# Patient Record
Sex: Male | Born: 1965 | Race: Black or African American | Hispanic: No | Smoking: Current every day smoker
Health system: Southern US, Community
[De-identification: ages and names within clinical notes are randomized; demographics above are authoritative.]

## PROBLEM LIST (undated history)

## (undated) HISTORY — PX: ANKLE SURGERY: SHX546

---

## 2002-08-23 ENCOUNTER — Emergency Department (HOSPITAL_COMMUNITY): Admission: EM | Admit: 2002-08-23 | Discharge: 2002-08-23 | Payer: Self-pay | Admitting: Emergency Medicine

## 2002-08-23 ENCOUNTER — Encounter: Payer: Self-pay | Admitting: *Deleted

## 2005-10-18 ENCOUNTER — Emergency Department (HOSPITAL_COMMUNITY): Admission: EM | Admit: 2005-10-18 | Discharge: 2005-10-18 | Payer: Self-pay | Admitting: Emergency Medicine

## 2005-11-03 ENCOUNTER — Observation Stay (HOSPITAL_COMMUNITY): Admission: RE | Admit: 2005-11-03 | Discharge: 2005-11-04 | Payer: Self-pay | Admitting: Orthopaedic Surgery

## 2012-06-23 ENCOUNTER — Emergency Department (HOSPITAL_COMMUNITY)
Admission: EM | Admit: 2012-06-23 | Discharge: 2012-06-23 | Disposition: A | Discharge status: Home / Self Care | Payer: Self-pay | Attending: Emergency Medicine | Admitting: Emergency Medicine

## 2012-06-23 ENCOUNTER — Encounter (HOSPITAL_COMMUNITY): Payer: Self-pay

## 2012-06-23 ENCOUNTER — Emergency Department (HOSPITAL_COMMUNITY): Payer: Self-pay

## 2012-06-23 DIAGNOSIS — S51009A Unspecified open wound of unspecified elbow, initial encounter: Secondary | ICD-10-CM | POA: Insufficient documentation

## 2012-06-23 DIAGNOSIS — S6990XA Unspecified injury of unspecified wrist, hand and finger(s), initial encounter: Secondary | ICD-10-CM | POA: Insufficient documentation

## 2012-06-23 DIAGNOSIS — S59909A Unspecified injury of unspecified elbow, initial encounter: Secondary | ICD-10-CM | POA: Insufficient documentation

## 2012-06-23 DIAGNOSIS — Z23 Encounter for immunization: Secondary | ICD-10-CM | POA: Insufficient documentation

## 2012-06-23 DIAGNOSIS — S5000XA Contusion of unspecified elbow, initial encounter: Secondary | ICD-10-CM

## 2012-06-23 DIAGNOSIS — IMO0002 Reserved for concepts with insufficient information to code with codable children: Secondary | ICD-10-CM

## 2012-06-23 DIAGNOSIS — M25529 Pain in unspecified elbow: Secondary | ICD-10-CM | POA: Insufficient documentation

## 2012-06-23 DIAGNOSIS — Y929 Unspecified place or not applicable: Secondary | ICD-10-CM | POA: Insufficient documentation

## 2012-06-23 MED ORDER — TETANUS-DIPHTH-ACELL PERTUSSIS 5-2.5-18.5 LF-MCG/0.5 IM SUSP
0.5000 mL | Freq: Once | INTRAMUSCULAR | Status: AC
  Administered 2012-06-23: 0.5 mL via INTRAMUSCULAR
  Filled 2012-06-23: qty 0.5
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
## 2012-06-23 NOTE — ED Provider Notes (Signed)
Medical screening examination/treatment/procedure(s) were performed by non-physician practitioner and as supervising physician I was immediately available for consultation/collaboration.   Carleene Cooper III, MD 06/23/12 [phone]
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
## 2012-12-30 ENCOUNTER — Emergency Department (HOSPITAL_COMMUNITY): Payer: Self-pay

## 2012-12-30 ENCOUNTER — Other Ambulatory Visit: Payer: Self-pay

## 2012-12-30 ENCOUNTER — Emergency Department (HOSPITAL_COMMUNITY)
Admission: EM | Admit: 2012-12-30 | Discharge: 2012-12-30 | Disposition: A | Discharge status: Home / Self Care | Payer: Self-pay | Attending: Emergency Medicine | Admitting: Emergency Medicine

## 2012-12-30 ENCOUNTER — Encounter (HOSPITAL_COMMUNITY): Payer: Self-pay | Admitting: Emergency Medicine

## 2012-12-30 DIAGNOSIS — R780 Finding of alcohol in blood: Secondary | ICD-10-CM | POA: Insufficient documentation

## 2012-12-30 DIAGNOSIS — F10229 Alcohol dependence with intoxication, unspecified: Secondary | ICD-10-CM | POA: Insufficient documentation

## 2012-12-30 DIAGNOSIS — F17200 Nicotine dependence, unspecified, uncomplicated: Secondary | ICD-10-CM | POA: Insufficient documentation

## 2012-12-30 LAB — CBC WITH DIFFERENTIAL/PLATELET
Basophils Absolute: 0 10*3/uL (ref 0.0–0.1)
Basophils Relative: 1 % (ref 0–1)
Eosinophils Absolute: 0 10*3/uL (ref 0.0–0.7)
Eosinophils Relative: 0 % (ref 0–5)
HCT: 42 % (ref 39.0–52.0)
Hemoglobin: 14.7 g/dL (ref 13.0–17.0)
Lymphocytes Relative: 47 % — ABNORMAL HIGH (ref 12–46)
MCH: 33.7 pg (ref 26.0–34.0)
MCHC: 35 g/dL (ref 30.0–36.0)
MCV: 96.3 fL (ref 78.0–100.0)
Neutro Abs: 3.5 10*3/uL (ref 1.7–7.7)
Neutrophils Relative %: 49 % (ref 43–77)
Platelets: 279 10*3/uL (ref 150–400)
RBC: 4.36 MIL/uL (ref 4.22–5.81)
RDW: 13 % (ref 11.5–15.5)
WBC: 7.1 10*3/uL (ref 4.0–10.5)

## 2012-12-30 LAB — LIPASE, BLOOD: Lipase: 82 U/L — ABNORMAL HIGH (ref 11–59)

## 2012-12-30 LAB — COMPREHENSIVE METABOLIC PANEL
ALT: 67 U/L — ABNORMAL HIGH (ref 0–53)
AST: 40 U/L — ABNORMAL HIGH (ref 0–37)
Albumin: 4.6 g/dL (ref 3.5–5.2)
Alkaline Phosphatase: 88 U/L (ref 39–117)
BUN: 10 mg/dL (ref 6–23)
CO2: 22 mEq/L (ref 19–32)
Calcium: 9.4 mg/dL (ref 8.4–10.5)
Chloride: 107 mEq/L (ref 96–112)
Creatinine, Ser: 0.99 mg/dL (ref 0.50–1.35)
GFR calc Af Amer: >90 mL/min (ref >90)
GFR calc non Af Amer: >90 mL/min (ref >90)
Glucose, Bld: 105 mg/dL — ABNORMAL HIGH (ref 70–99)
Potassium: 3.8 mEq/L (ref 3.5–5.1)
Sodium: 143 mEq/L (ref 135–145)
Total Bilirubin: 0.3 mg/dL (ref 0.3–1.2)

## 2012-12-30 LAB — ETHANOL: Alcohol, Ethyl (B): 349 mg/dL — ABNORMAL HIGH (ref 0–11)

## 2012-12-30 MED ORDER — FAMOTIDINE 40 MG PO TABS: 40.0000 mg | ORAL_TABLET | Freq: Two times a day (BID) | ORAL | Status: DC | PRN

## 2012-12-30 MED ORDER — ONDANSETRON 8 MG PO TBDP: 8.0000 mg | ORAL_TABLET | Freq: Three times a day (TID) | ORAL | Status: DC | PRN

## 2012-12-30 NOTE — ED Provider Notes (Signed)
History    CSN: [id number] Arrival date & time 12/30/12  1350 First MD Initiated Contact with Patient 12/30/12 1352    Chief Complaint  Patient presents with  . Chest Pain    HPI The patient presented to the emergency room today because he has been having intermittent episodes of sharp moderate to severe chest pain in the substernal region off and on for the last 3 weeks. Patient states the pain would just come and go. Nothing seems to bring it on. The pain is sharp. It does not radiate. He does not relate it to eating or drinking or exertion.  Patient had another episode of the pain today so decided to come and get it evaluated in the emergency department.  The patient was celebrating today in anticipation of the Superbowl. He has had several drinks of moonshine.  He denies history of pancreatitis or liver problems. History reviewed. No pertinent past medical history.  Past Surgical History  Procedure Date  . Ankle surgery     No family history on file.  History  Substance Use Topics  . Smoking status: Current Every Day Smoker    Types: Cigarettes  . Smokeless tobacco: Not on file  . Alcohol Use: Yes      Review of Systems  All other systems reviewed and are negative.    Allergies  Review of patient's allergies indicates no known allergies.  Home Medications  No current outpatient prescriptions on file.  BP 155/94  Pulse 97  Temp 98.4 F (36.9 C) (Oral)  Resp 21  Ht 6' (1.829 m)  Wt 150 lb (68.04 kg)  BMI 20.34 kg/m2  SpO2 96%  Physical Exam  Nursing note and vitals reviewed. Constitutional: He appears well-developed and well-nourished. No distress.       intoxicated  HENT:  Head: Normocephalic and atraumatic.  Right Ear: External ear normal.  Left Ear: External ear normal.       Breath smells of alcohol  Eyes: Conjunctivae normal are normal. Right eye exhibits no discharge. Left eye exhibits no discharge. No scleral icterus.  Neck: Neck supple. No  tracheal deviation present.  Cardiovascular: Normal rate, regular rhythm and intact distal pulses.   Pulmonary/Chest: Effort normal and breath sounds normal. No stridor. No respiratory distress. He has no wheezes. He has no rales.  Abdominal: Soft. Bowel sounds are normal. He exhibits no distension. There is no tenderness. There is no rebound and no guarding.  Musculoskeletal: He exhibits no edema and no tenderness.  Neurological: He is alert. He has normal strength. No sensory deficit. Cranial nerve deficit:  no gross defecits noted. He exhibits normal muscle tone. He displays no seizure activity. Coordination normal.  Skin: Skin is warm. No rash noted. He is diaphoretic.  Psychiatric: He has a normal mood and affect.    ED Course  Procedures (including critical care time) EKG Rate 95 Normal sinus rhythm Nonspecific T wave abnormality Abnormal ECG Labs Reviewed  CBC WITH DIFFERENTIAL - Abnormal; Notable for the following:    Lymphocytes Relative 47 (*)     All other components within normal limits  COMPREHENSIVE METABOLIC PANEL - Abnormal; Notable for the following:    Glucose, Bld 105 (*)     AST 40 (*)     ALT 67 (*)     All other components within normal limits  LIPASE, BLOOD - Abnormal; Notable for the following:    Lipase 82 (*)     All other components within normal limits  ETHANOL - Abnormal; Notable for the following:    Alcohol, Ethyl (B) 349 (*)     All other components within normal limits  TROPONIN I   Dg Chest Port 1 View  12/30/2012  *RADIOLOGY REPORT*  Clinical Data: Chest pain, confusion  PORTABLE CHEST - 1 VIEW  Comparison: None.  Findings:  Examination minimally degraded secondary patient motion artifact.  Borderline enlarged cardiac silhouette.  Normal mediastinal contours.  No focal parenchymal opacity.  No definite pleural effusion or pneumothorax.  No definite evidence of edema.  No acute osseous abnormalities.  IMPRESSION: No definite acute cardiopulmonary  disease on this AP portable exam.   Original Report Authenticated By: Tacey Ruiz, MD     1. 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 His alcohol level is extremely elevated at 349. Patient is alert and oriented. I suspect he has a significant amount of situation.  I discussed the findings with the patient and the family. I explained to him how he needs to stop drinking.  Patient and family understand and they will try to dissuade him from drinking alcohol to such an excess in the future.  The patient can safely be discharged home in the care of his family at this time. I will give him a prescription for Zofran and Pepcid.       Celene Kras, MD 12/30/12 [phone]
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
## 2012-12-30 NOTE — ED Notes (Signed)
Pt c/o cp. Pt smells of etoh-states he has had several drinks of moonshine today.

## 2013-02-02 ENCOUNTER — Emergency Department (HOSPITAL_COMMUNITY)
Admission: EM | Admit: 2013-02-02 | Discharge: 2013-02-02 | Disposition: A | Discharge status: Home / Self Care | Payer: Self-pay | Attending: Emergency Medicine | Admitting: Emergency Medicine

## 2013-02-02 ENCOUNTER — Emergency Department (HOSPITAL_COMMUNITY): Payer: Self-pay

## 2013-02-02 ENCOUNTER — Encounter (HOSPITAL_COMMUNITY): Payer: Self-pay | Admitting: Emergency Medicine

## 2013-02-02 DIAGNOSIS — IMO0002 Reserved for concepts with insufficient information to code with codable children: Secondary | ICD-10-CM | POA: Insufficient documentation

## 2013-02-02 DIAGNOSIS — Y929 Unspecified place or not applicable: Secondary | ICD-10-CM | POA: Insufficient documentation

## 2013-02-02 DIAGNOSIS — Y9389 Activity, other specified: Secondary | ICD-10-CM | POA: Insufficient documentation

## 2013-02-02 DIAGNOSIS — F17200 Nicotine dependence, unspecified, uncomplicated: Secondary | ICD-10-CM | POA: Insufficient documentation

## 2013-02-02 DIAGNOSIS — M5416 Radiculopathy, lumbar region: Secondary | ICD-10-CM

## 2013-02-02 DIAGNOSIS — X500XXA Overexertion from strenuous movement or load, initial encounter: Secondary | ICD-10-CM | POA: Insufficient documentation

## 2013-02-02 MED ORDER — PREDNISONE 10 MG PO TABS: ORAL_TABLET | ORAL | Status: DC

## 2013-02-02 MED ORDER — CYCLOBENZAPRINE HCL 10 MG PO TABS: 10.0000 mg | ORAL_TABLET | Freq: Three times a day (TID) | ORAL | Status: DC | PRN

## 2013-02-02 MED ORDER — CYCLOBENZAPRINE HCL 10 MG PO TABS
10.0000 mg | ORAL_TABLET | Freq: Once | ORAL | Status: AC
  Administered 2013-02-02: 10 mg via ORAL
  Filled 2013-02-02: qty 1

## 2013-02-02 MED ORDER — HYDROMORPHONE HCL PF 1 MG/ML IJ SOLN
1.0000 mg | Freq: Once | INTRAMUSCULAR | Status: AC
  Administered 2013-02-02: 1 mg via INTRAMUSCULAR
  Filled 2013-02-02: qty 1

## 2013-02-02 MED ORDER — OXYCODONE-ACETAMINOPHEN 5-325 MG PO TABS: 1.0000 | ORAL_TABLET | ORAL | Status: DC | PRN

## 2013-02-02 NOTE — ED Notes (Signed)
States that he started having lower back pain that started 1 week ago, states that he slipped off a curb yesterday, however did not fall.  States that the pain is worse today.

## 2013-02-02 NOTE — ED Provider Notes (Signed)
History     CSN: [id number]  Arrival date & time 02/02/13  1207   First MD Initiated Contact with Patient 02/02/13 1225      Chief Complaint  Patient presents with  . Back Pain    (Consider location/radiation/quality/duration/timing/severity/associated sxs/prior treatment) HPI Comments: patient with hx of chronic low back pain c/o worsening pain for one week.  States the pain became worse yesterday after he stepped off a curb and twisted his back. He denies fall.  Also states the pain radiates into his right thigh which is different from previous pain.  He denies numbness or weakness of the extremities, dysuria, incontinence of bladder or bowel, saddle anesthesia's or abd pain.    Patient is a 47 y.o. male presenting with back pain. The history is provided by the patient.  Back Pain Location:  Lumbar spine Quality:  Aching, stiffness and shooting Radiates to:  R thigh Pain severity:  Moderate Onset quality:  Gradual Duration:  7 days Timing:  Constant Progression:  Worsening Chronicity:  Recurrent Context: recent injury and twisting   Context: not emotional stress, not falling, not jumping from heights, not MCA, not physical stress and not recent illness   Relieved by:  Being still Worsened by:  Ambulation, bending, movement, standing and twisting Ineffective treatments:  OTC medications Associated symptoms: leg pain   Associated symptoms: no abdominal pain, no abdominal swelling, no bladder incontinence, no bowel incontinence, no chest pain, no dysuria, no fever, no headaches, no numbness, no paresthesias, no pelvic pain, no perianal numbness, no tingling and no weakness     History reviewed. No pertinent past medical history.  Past Surgical History  Procedure Laterality Date  . Ankle surgery      No family history on file.  History  Substance Use Topics  . Smoking status: Current Every Day Smoker    Types: Cigarettes  . Smokeless tobacco: Not on file  . Alcohol  Use: Yes      Review of Systems  Constitutional: Negative for fever.  Respiratory: Negative for shortness of breath.   Cardiovascular: Negative for chest pain.  Gastrointestinal: Negative for vomiting, abdominal pain, constipation and bowel incontinence.  Genitourinary: Negative for bladder incontinence, dysuria, hematuria, flank pain, decreased urine volume, difficulty urinating and pelvic pain.       No perineal numbness or incontinence of urine or feces  Musculoskeletal: Positive for back pain. Negative for joint swelling.  Skin: Negative for rash.  Neurological: Negative for tingling, weakness, numbness, headaches and paresthesias.  All other systems reviewed and are negative.    Allergies  Strawberry  Home Medications   Current Outpatient Rx  Name  Route  Sig  Dispense  Refill  . acetaminophen (TYLENOL) 500 MG tablet   Oral   Take 2,000 mg by mouth every 6 (six) hours as needed for pain.         . famotidine (PEPCID) 20 MG tablet   Oral   Take 20 mg by mouth daily as needed for heartburn.         . naproxen sodium (ALEVE) 220 MG tablet   Oral   Take 880 mg by mouth 2 (two) times daily as needed (pain).            BP 168/101  Pulse 82  Temp(Src) 97.9 F (36.6 C) (Oral)  Resp 20  Ht 6' (1.829 m)  Wt 260 lb (117.935 kg)  BMI 35.25 kg/m2  SpO2 100%  Physical Exam  Nursing note and vitals  reviewed. Constitutional: He is oriented to person, place, and time. He appears well-developed and well-nourished. No distress.  HENT:  Head: Normocephalic and atraumatic.  Neck: Normal range of motion. Neck supple.  Cardiovascular: Normal rate, regular rhythm, normal heart sounds and intact distal pulses.   No murmur heard. Pulmonary/Chest: Effort normal and breath sounds normal. No respiratory distress.  Abdominal: He exhibits no distension. There is no tenderness. There is no rebound and no guarding.  Musculoskeletal: He exhibits tenderness. He exhibits no edema.         Lumbar back: He exhibits tenderness and pain. He exhibits normal range of motion, no swelling, no deformity, no laceration and normal pulse.       Back:  ttp of the right lumbar paraspinal muscles and SI joint.  Distal sensation intact.  DP pulses are brisk and symmetrical  Neurological: He is alert and oriented to person, place, and time. No sensory deficit. He exhibits normal muscle tone. Coordination and gait normal.  Reflex Scores:      Patellar reflexes are 2+ on the right side and 2+ on the left side.      Achilles reflexes are 2+ on the right side and 2+ on the left side. Skin: Skin is warm and dry.    ED Course  Procedures (including critical care time)  Labs Reviewed - No data to display Dg Lumbar Spine Complete  02/02/2013  *RADIOLOGY REPORT*  Clinical Data: Fall, low back pain  LUMBAR SPINE - COMPLETE 4+ VIEW  Comparison: None.  Findings: Vestigial left rib at T12.  Five lumbar-type vertebral bodies.  Normal lumbar lordosis.  No evidence of fracture dislocation.  Vertebral body heights are maintained.  Mild degenerative changes of the lower lumbar spine.  Visualized bony pelvis appears intact.  IMPRESSION: No fracture or dislocation is seen.  Mild degenerative changes of the lower lumbar spine.   Original Report Authenticated By: Charline Bills, M.D.        MDM    Patient has ttp of the right lumbar paraspinal muscles and SI joint.  No focal neuro deficits on exam.  Ambulates with a steady gait.   Pain reproduced with SLR on right.  Doubt emergent neurological or infectious process.    Pain improved with IM dilaudid and flexeril po.    Pt agrees to close f/u with PMD.    Prescribed: Percocet #20 Prednisone taper flexeril      Tammy L. Trisha Mangle, PA-C 02/03/13 [phone]

## 2013-02-03 NOTE — ED Provider Notes (Signed)
Medical screening examination/treatment/procedure(s) were performed by non-physician practitioner and as supervising physician I was immediately available for consultation/collaboration.   Lyanne Co, MD 02/03/13 1550

## 2014-01-31 ENCOUNTER — Emergency Department (HOSPITAL_COMMUNITY): Payer: Self-pay

## 2014-01-31 ENCOUNTER — Encounter (HOSPITAL_COMMUNITY): Payer: Self-pay | Admitting: Emergency Medicine

## 2014-01-31 ENCOUNTER — Emergency Department (HOSPITAL_COMMUNITY)
Admission: EM | Admit: 2014-01-31 | Discharge: 2014-02-01 | Disposition: A | Discharge status: Home / Self Care | Payer: Self-pay | Attending: Emergency Medicine | Admitting: Emergency Medicine

## 2014-01-31 DIAGNOSIS — F1010 Alcohol abuse, uncomplicated: Secondary | ICD-10-CM | POA: Insufficient documentation

## 2014-01-31 DIAGNOSIS — H11429 Conjunctival edema, unspecified eye: Secondary | ICD-10-CM | POA: Insufficient documentation

## 2014-01-31 DIAGNOSIS — F10929 Alcohol use, unspecified with intoxication, unspecified: Secondary | ICD-10-CM

## 2014-01-31 DIAGNOSIS — S0181XA Laceration without foreign body of other part of head, initial encounter: Secondary | ICD-10-CM

## 2014-01-31 DIAGNOSIS — H11422 Conjunctival edema, left eye: Secondary | ICD-10-CM

## 2014-01-31 DIAGNOSIS — S0120XA Unspecified open wound of nose, initial encounter: Secondary | ICD-10-CM | POA: Insufficient documentation

## 2014-01-31 DIAGNOSIS — H0520 Unspecified exophthalmos: Secondary | ICD-10-CM | POA: Insufficient documentation

## 2014-01-31 DIAGNOSIS — S0180XA Unspecified open wound of other part of head, initial encounter: Secondary | ICD-10-CM | POA: Insufficient documentation

## 2014-01-31 DIAGNOSIS — S0990XA Unspecified injury of head, initial encounter: Secondary | ICD-10-CM | POA: Insufficient documentation

## 2014-01-31 DIAGNOSIS — S01119A Laceration without foreign body of unspecified eyelid and periocular area, initial encounter: Secondary | ICD-10-CM | POA: Insufficient documentation

## 2014-01-31 DIAGNOSIS — S022XXA Fracture of nasal bones, initial encounter for closed fracture: Secondary | ICD-10-CM | POA: Insufficient documentation

## 2014-01-31 LAB — COMPREHENSIVE METABOLIC PANEL
ALT: 55 U/L — ABNORMAL HIGH (ref 0–53)
AST: 31 U/L (ref 0–37)
Albumin: 4.4 g/dL (ref 3.5–5.2)
Alkaline Phosphatase: 94 U/L (ref 39–117)
BILIRUBIN TOTAL: 0.3 mg/dL (ref 0.3–1.2)
BUN: 11 mg/dL (ref 6–23)
CHLORIDE: 104 meq/L (ref 96–112)
CO2: 25 meq/L (ref 19–32)
Calcium: 9.5 mg/dL (ref 8.4–10.5)
Creatinine, Ser: 0.99 mg/dL (ref 0.50–1.35)
GFR calc Af Amer: >90 mL/min (ref >90)
GLUCOSE: 98 mg/dL (ref 70–99)
Potassium: 4 mEq/L (ref 3.7–5.3)
Sodium: 143 mEq/L (ref 137–147)
Total Protein: 8.2 g/dL (ref 6.0–8.3)

## 2014-01-31 LAB — CBC WITH DIFFERENTIAL/PLATELET
Basophils Absolute: 0 10*3/uL (ref 0.0–0.1)
Basophils Relative: 0 % (ref 0–1)
EOS ABS: 0 10*3/uL (ref 0.0–0.7)
Eosinophils Relative: 0 % (ref 0–5)
HCT: 42.8 % (ref 39.0–52.0)
Hemoglobin: 14.6 g/dL (ref 13.0–17.0)
LYMPHS ABS: 2.1 10*3/uL (ref 0.7–4.0)
Lymphocytes Relative: 20 % (ref 12–46)
MCH: 33 pg (ref 26.0–34.0)
MCHC: 34.1 g/dL (ref 30.0–36.0)
MCV: 96.6 fL (ref 78.0–100.0)
Monocytes Absolute: 0.4 10*3/uL (ref 0.1–1.0)
Monocytes Relative: 4 % (ref 3–12)
NEUTROS PCT: 76 % (ref 43–77)
Neutro Abs: 8.1 10*3/uL — ABNORMAL HIGH (ref 1.7–7.7)
Platelets: 311 10*3/uL (ref 150–400)
RBC: 4.43 MIL/uL (ref 4.22–5.81)
RDW: 13.1 % (ref 11.5–15.5)
WBC: 10.7 10*3/uL — ABNORMAL HIGH (ref 4.0–10.5)

## 2014-01-31 LAB — ETHANOL: Alcohol, Ethyl (B): 229 mg/dL — ABNORMAL HIGH (ref 0–11)

## 2014-01-31 LAB — PROTIME-INR
INR: 0.96 (ref 0.00–1.49)
PROTHROMBIN TIME: 12.6 s (ref 11.6–15.2)
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
## 2014-01-31 NOTE — ED Notes (Addendum)
Patient refusing blood work at this time MD notified

## 2014-01-31 NOTE — ED Notes (Signed)
Patient states that he will have his blood drawn at this time. States that his left eye is hurting and would like some pain medication at this.
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
## 2014-02-01 MED ORDER — BACITRACIN ZINC 500 UNIT/GM EX OINT
TOPICAL_OINTMENT | Freq: Once | CUTANEOUS | Status: AC
  Administered 2014-02-01: 1 via TOPICAL

## 2014-02-01 MED ORDER — HYDROCODONE-ACETAMINOPHEN 5-325 MG PO TABS: 1.0000 | ORAL_TABLET | ORAL | Status: AC | PRN

## 2014-02-01 MED ORDER — BACITRACIN ZINC 500 UNIT/GM EX OINT
TOPICAL_OINTMENT | CUTANEOUS | Status: AC
  Filled 2014-02-01: qty 0.9

## 2014-02-01 NOTE — ED Notes (Signed)
Call Elouise Munroeanielle Shappell, daughter, @ [phone] when patient is ready to be discharged.

## 2014-02-01 NOTE — Discharge Instructions (Signed)
Alcohol Intoxication Alcohol intoxication occurs when the amount of alcohol that a person has consumed impairs his or her ability to mentally and physically function. Alcohol directly impairs the normal chemical activity of the brain. Drinking large amounts of alcohol can lead to changes in mental function and behavior, and it can cause many physical effects that can be harmful.  Alcohol intoxication can range in severity from mild to very severe. Various factors can affect the level of intoxication that occurs, such as the person's age, gender, weight, frequency of alcohol consumption, and the presence of other medical conditions (such as diabetes, seizures, or heart conditions). Dangerous levels of alcohol intoxication may occur when people drink large amounts of alcohol in a short period (binge drinking). Alcohol can also be especially dangerous when combined with certain prescription medicines or "recreational" drugs. SIGNS AND SYMPTOMS Some common signs and symptoms of mild alcohol intoxication include:  Loss of coordination.  Changes in mood and behavior.  Impaired judgment.  Slurred speech. As alcohol intoxication progresses to more severe levels, other signs and symptoms will appear. These may include:  Vomiting.  Confusion and impaired memory.  Slowed breathing.  Seizures.  Loss of consciousness. DIAGNOSIS  Your health care provider will take a medical history and perform a physical exam. You will be asked about the amount and type of alcohol you have consumed. Blood tests will be done to measure the concentration of alcohol in your blood. In many places, your blood alcohol level must be lower than 80 mg/dL (2.95%) to legally drive. However, many dangerous effects of alcohol can occur at much lower levels.  TREATMENT  People with alcohol intoxication often do not require treatment. Most of the effects of alcohol intoxication are temporary, and they go away as the alcohol naturally  leaves the body. Your health care provider will monitor your condition until you are stable enough to go home. Fluids are sometimes given through an IV access tube to help prevent dehydration.  HOME CARE INSTRUCTIONS  Do not drive after drinking alcohol.  Stay hydrated. Drink enough water and fluids to keep your urine clear or pale yellow. Avoid caffeine.   Only take over-the-counter or prescription medicines as directed by your health care provider.  SEEK MEDICAL CARE IF:   You have persistent vomiting.   You do not feel better after a few days.  You have frequent alcohol intoxication. Your health care provider can help determine if you should see a substance use treatment counselor. SEEK IMMEDIATE MEDICAL CARE IF:   You become shaky or tremble when you try to stop drinking.   You shake uncontrollably (seizure).   You throw up (vomit) blood. This may be bright red or may look like black coffee grounds.   You have blood in your stool. This may be bright red or may appear as a black, tarry, bad smelling stool.   You become lightheaded or faint.  MAKE SURE YOU:   Understand these instructions.  Will watch your condition.  Will get help right away if you are not doing well or get worse. Document Released: 08/24/2005 Document Revised: 07/17/2013 Document Reviewed: 04/19/2013 [facility] Patient Information 2014 [place], [place].  Assault, General Assault includes any behavior, whether intentional or reckless, which results in bodily injury to another person and/or damage to property. Included in this would be any behavior, intentional or reckless, that by its nature would be understood (interpreted) by a reasonable person as intent to harm another person or to damage his/her property.  Threats may be oral or written. They may be communicated through regular mail, computer, fax, or phone. These threats may be direct or implied. FORMS OF ASSAULT INCLUDE:  Physically assaulting a  person. This includes physical threats to inflict physical harm as well as:  Slapping.  Hitting.  Poking.  Kicking.  Punching.  Pushing.  Arson.  Sabotage.  Equipment vandalism.  Damaging or destroying property.  Throwing or hitting objects.  Displaying a weapon or an object that appears to be a weapon in a threatening manner.  Carrying a firearm of any kind.  Using a weapon to harm someone.  Using greater physical size/strength to intimidate another.  Making intimidating or threatening gestures.  Bullying.  Hazing.  Intimidating, threatening, hostile, or abusive language directed toward another person.  It communicates the intention to engage in violence against that person. And it leads a reasonable person to expect that violent behavior may occur.  Stalking another person. IF IT HAPPENS AGAIN:  Immediately call for emergency help (911 in [place]  If someone poses clear and immediate danger to you, seek legal authorities to have a protective or restraining order put in place.  Less threatening assaults can at least be reported to authorities. STEPS TO TAKE IF A SEXUAL ASSAULT HAS HAPPENED  Go to an area of safety. This may include a shelter or staying with a friend. Stay away from the area where you have been attacked. A large percentage of sexual assaults are caused by a friend, relative or associate.  If medications were given by your caregiver, take them as directed for the full length of time prescribed.  Only take over-the-counter or prescription medicines for pain, discomfort, or fever as directed by your caregiver.  If you have come in contact with a sexual disease, find out if you are to be tested again. If your caregiver is concerned about the HIV/AIDS virus, he/she may require you to have continued testing for several months.  For the protection of your privacy, test results can not be given over the phone. Make sure you receive the results of  your test. If your test results are not back during your visit, make an appointment with your caregiver to find out the results. Do not assume everything is normal if you have not heard from your caregiver or the medical facility. It is important for you to follow up on all of your test results.  File appropriate papers with authorities. This is important in all assaults, even if it has occurred in a family or by a friend. SEEK MEDICAL CARE IF:  You have new problems because of your injuries.  You have problems that may be because of the medicine you are taking, such as:  Rash.  Itching.  Swelling.  Trouble breathing.  You develop belly (abdominal) pain, feel sick to your stomach (nausea) or are vomiting.  You begin to run a temperature.  You need supportive care or referral to a rape crisis center. These are centers with trained personnel who can help you get through this ordeal. SEEK IMMEDIATE MEDICAL CARE IF:  You are afraid of being threatened, beaten, or abused. In [place] call 911.  You receive new injuries related to abuse.  You develop severe pain in any area injured in the assault or have any change in your condition that concerns you.  You faint or lose consciousness.  You develop chest pain or shortness of breath. Document Released: 11/14/2005 Document Revised: 02/06/2012 Document Reviewed: 07/02/2008 [employer] Patient  Information 2014 [place], [place].  Facial Laceration  A facial laceration is a cut on the face. These injuries can be painful and cause bleeding. Lacerations usually heal quickly, but they need special care to reduce scarring. DIAGNOSIS  Your health care provider will take a medical history, ask for details about how the injury occurred, and examine the wound to determine how deep the cut is. TREATMENT  Some facial lacerations may not require closure. Others may not be able to be closed because of an increased risk of infection. The risk of infection  and the chance for successful closure will depend on various factors, including the amount of time since the injury occurred. The wound may be cleaned to help prevent infection. If closure is appropriate, pain medicines may be given if needed. Your health care provider will use stitches (sutures), wound glue (adhesive), or skin adhesive strips to repair the laceration. These tools bring the skin edges together to allow for faster healing and a better cosmetic outcome. If needed, you may also be given a tetanus shot. HOME CARE INSTRUCTIONS  Only take over-the-counter or prescription medicines as directed by your health care provider.  Follow your health care provider's instructions for wound care. These instructions will vary depending on the technique used for closing the wound. For Sutures:  Keep the wound clean and dry.   If you were given a bandage (dressing), you should change it at least once a day. Also change the dressing if it becomes wet or dirty, or as directed by your health care provider.   Wash the wound with soap and water 2 times a day. Rinse the wound off with water to remove all soap. Pat the wound dry with a clean towel.   After cleaning, apply a thin layer of the antibiotic ointment recommended by your health care provider. This will help prevent infection and keep the dressing from sticking.   You may shower as usual after the first 24 hours. Do not soak the wound in water until the sutures are removed.   Get your sutures removed as directed by your health care provider. With facial lacerations, sutures should usually be taken out after 4 5 days to avoid stitch marks.   Wait a few days after your sutures are removed before applying any makeup. For Skin Adhesive Strips:  Keep the wound clean and dry.   Do not get the skin adhesive strips wet. You may bathe carefully, using caution to keep the wound dry.   If the wound gets wet, pat it dry with a clean towel.    Skin adhesive strips will fall off on their own. You may trim the strips as the wound heals. Do not remove skin adhesive strips that are still stuck to the wound. They will fall off in time.  For Wound Adhesive:  You may briefly wet your wound in the shower or bath. Do not soak or scrub the wound. Do not swim. Avoid periods of heavy sweating until the skin adhesive has fallen off on its own. After showering or bathing, gently pat the wound dry with a clean towel.   Do not apply liquid medicine, cream medicine, ointment medicine, or makeup to your wound while the skin adhesive is in place. This may loosen the film before your wound is healed.   If a dressing is placed over the wound, be careful not to apply tape directly over the skin adhesive. This may cause the adhesive to be pulled off before  the wound is healed.   Avoid prolonged exposure to sunlight or tanning lamps while the skin adhesive is in place.  The skin adhesive will usually remain in place for 5 10 days, then naturally fall off the skin. Do not pick at the adhesive film.  After Healing: Once the wound has healed, cover the wound with sunscreen during the day for 1 full year. This can help minimize scarring. Exposure to ultraviolet light in the first year will darken the scar. It can take 1 2 years for the scar to lose its redness and to heal completely.  SEEK IMMEDIATE MEDICAL CARE IF:  You have redness, pain, or swelling around the wound.   You see ayellowish-white fluid (pus) coming from the wound.   You have chills or a fever.  MAKE SURE YOU:  Understand these instructions.  Will watch your condition.  Will get help right away if you are not doing well or get worse. Document Released: 12/22/2004 Document Revised: 09/04/2013 Document Reviewed: 06/27/2013 [facility] Patient Information 2014 [place], [place]  Nasal Fracture A nasal fracture is a break or crack in the bones of the nose. A minor break usually  heals in a month. You often will receive black eyes from a nasal fracture. This is not a cause for concern. The black eyes will go away over 1 to 2 weeks.  DIAGNOSIS  Your caregiver may want to examine you if you are concerned about a fracture of the nose. X-rays of the nose may not show a nasal fracture even when one is present. Sometimes your caregiver must wait 1 to 5 days after the injury to re-check the nose for alignment and to take additional X-rays. Sometimes the caregiver must wait until the swelling has gone down. TREATMENT Minor fractures that have caused no deformity often do not require treatment. More serious fractures where bones are displaced may require surgery. This will take place after the swelling is gone. Surgery will stabilize and align the fracture. HOME CARE INSTRUCTIONS   Put ice on the injured area.  Put ice in a plastic bag.  Place a towel between your skin and the bag.  Leave the ice on for 15-20 minutes, 03-04 times a day.  Take medications as directed by your caregiver.  Only take over-the-counter or prescription medicines for pain, discomfort, or fever as directed by your caregiver.  If your nose starts bleeding, squeeze the soft parts of the nose against the center wall while you are sitting in an upright position for 10 minutes.  Contact sports should be avoided for at least 3 to 4 weeks or as directed by your caregiver. SEEK MEDICAL CARE IF:  Your pain increases or becomes severe.  You continue to have nosebleeds.  The shape of your nose does not return to normal within 5 days.  You have pus draining from the nose. SEEK IMMEDIATE MEDICAL CARE IF:   You have bleeding from your nose that does not stop after 20 minutes of pinching the nostrils closed and keeping ice on the nose.  You have clear fluid draining from your nose.  You notice a grape-like swelling on the dividing wall between the nostrils (septum). This is a collection of blood  (hematoma) that must be drained to help prevent infection.  You have difficulty moving your eyes.  You have recurrent vomiting. Document Released: 11/11/2000 Document Revised: 02/06/2012 Document Reviewed: 02/28/2011 [facility] Patient Information 2014 [place], [place].

## 2014-02-01 NOTE — ED Notes (Signed)
Daughter called to come and pick up patient. States that she will be on her way.

## 2014-02-19 ENCOUNTER — Encounter (HOSPITAL_COMMUNITY): Payer: Self-pay | Admitting: Emergency Medicine

## 2014-02-19 ENCOUNTER — Emergency Department (HOSPITAL_COMMUNITY)
Admission: EM | Admit: 2014-02-19 | Discharge: 2014-02-19 | Disposition: A | Discharge status: Home / Self Care | Payer: Self-pay | Attending: Emergency Medicine | Admitting: Emergency Medicine

## 2014-02-19 DIAGNOSIS — Z4802 Encounter for removal of sutures: Secondary | ICD-10-CM | POA: Insufficient documentation

## 2014-02-19 DIAGNOSIS — F17200 Nicotine dependence, unspecified, uncomplicated: Secondary | ICD-10-CM | POA: Insufficient documentation

## 2014-02-19 NOTE — ED Provider Notes (Signed)
CSN: [id number]     Arrival date & time 02/19/14  [id number] History   First MD Initiated Contact with Patient 02/19/14 [phone]     Chief Complaint  Patient presents with  . Suture / Staple Removal     (Consider location/radiation/quality/duration/timing/severity/associated sxs/prior Treatment) Patient is a 48 y.o. male presenting with suture removal.  Suture / Staple Removal The current episode started 1 to 4 weeks ago. Pertinent negatives include no fever.  2 weeks ago sutures were placed. Here for suture removal. He reports 4 sutures at the bridge of his nose and 3 on his left eyelid. No drainage, erythema or swelling reported.    History reviewed. No pertinent past medical history. Past Surgical History  Procedure Laterality Date  . Ankle surgery     No family history on file. History  Substance Use Topics  . Smoking status: Current Every Day Smoker    Types: Cigarettes  . Smokeless tobacco: Not on file  . Alcohol Use: Yes     Comment: occ    Review of Systems  Constitutional: Negative for fever.  Skin: Positive for wound.       Healed lacerations. Nose and left eyelid  All other systems reviewed and are negative.      Allergies  Strawberry  Home Medications   Current Outpatient Rx  Name  Route  Sig  Dispense  Refill  . HYDROcodone-acetaminophen (NORCO/VICODIN) 5-325 MG per tablet   Oral   Take 1 tablet by mouth every 4 (four) hours as needed.   15 tablet   0    BP 145/98  Pulse 68  Temp(Src) 98 F (36.7 C) (Oral)  Resp 20  Ht 6' (1.829 m)  Wt 265 lb (120.203 kg)  BMI 35.93 kg/m2  SpO2 99% Physical Exam  Nursing note and vitals reviewed. Constitutional: He is oriented to person, place, and time. He appears well-developed and well-nourished. No distress.  Well-appearing  HENT:  Head: Normocephalic.  Right Ear: External ear normal.  Left Ear: External ear normal.  Eyes: Conjunctivae and EOM are normal.  Neck: Normal range of motion. Neck supple.   Cardiovascular: Normal rate, regular rhythm and normal heart sounds.   Neurological: He is alert and oriented to person, place, and time.  Skin: Skin is warm and dry. No rash noted. No erythema.  Healed lacerations x2. Bridge of nose and left eyelid.   Psychiatric: He has a normal mood and affect. His behavior is normal. Judgment and thought content normal.    ED Course  Procedures (including critical care time) Labs Review Labs Reviewed - No data to display Imaging Review No results found.   EKG Interpretation None      MDM   Final diagnoses:  Visit for suture removal   Completely healed lacerations. #1 on the bridge of his nose, 4 sutures removed. #2 left eyelid, 3 sutures removed. Discussed skin care with pt.      Irish EldersKelly Aidee Latimore, NP 02/19/14 [phone]

## 2014-02-19 NOTE — ED Provider Notes (Signed)
Medical screening examination/treatment/procedure(s) were performed by non-physician practitioner and as supervising physician I was immediately available for consultation/collaboration.   EKG Interpretation None        Akash Winski L Viviana Trimble, MD 02/19/14 1351 

## 2014-02-19 NOTE — Discharge Instructions (Signed)
Suture Removal, Care After Refer to this sheet in the next few weeks. These instructions provide you with information on caring for yourself after your procedure. Your health care provider may also give you more specific instructions. Your treatment has been planned according to current medical practices, but problems sometimes occur. Call your health care provider if you have any problems or questions after your procedure. WHAT TO EXPECT AFTER THE PROCEDURE After your stitches (sutures) are removed, it is typical to have the following:  Some discomfort and swelling in the wound area.  Slight redness in the area. HOME CARE INSTRUCTIONS   If you have skin adhesive strips over the wound area, do not take the strips off. They will fall off on their own in a few days. If the strips remain in place after 14 days, you may remove them.  Change any bandages (dressings) at least once a day or as directed by your health care provider. If the bandage sticks, soak it off with warm, soapy water.  Apply cream or ointment only as directed by your health care provider. If using cream or ointment, wash the area with soap and water 2 times a day to remove all the cream or ointment. Rinse off the soap and pat the area dry with a clean towel.  Keep the wound area dry and clean. If the bandage becomes wet or dirty, or if it develops a bad smell, change it as soon as possible.  Continue to protect the wound from injury.  Use sunscreen when out in the sun. New scars become sunburned easily. SEEK MEDICAL CARE IF:  You have increasing redness, swelling, or pain in the wound.  You see pus coming from the wound.  You have a fever.  You notice a bad smell coming from the wound or dressing.  Your wound breaks open (edges not staying together). Document Released: 08/09/2001 Document Revised: 09/04/2013 Document Reviewed: 06/26/2013 [employer] Patient Information 2014 [employer].  

## 2014-02-19 NOTE — ED Notes (Signed)
Pt here for suture removal.  Reports sutures have been in for over 2 weeks.

## 2015-01-18 IMAGING — CR DG LUMBAR SPINE COMPLETE 4+V
5 series · 5 of 5 positions shown · non-contrast
Comparison: None.

CLINICAL DATA: Fall, low back pain

LUMBAR SPINE - COMPLETE 4+ VIEW

[view not recorded (1 of 5)]
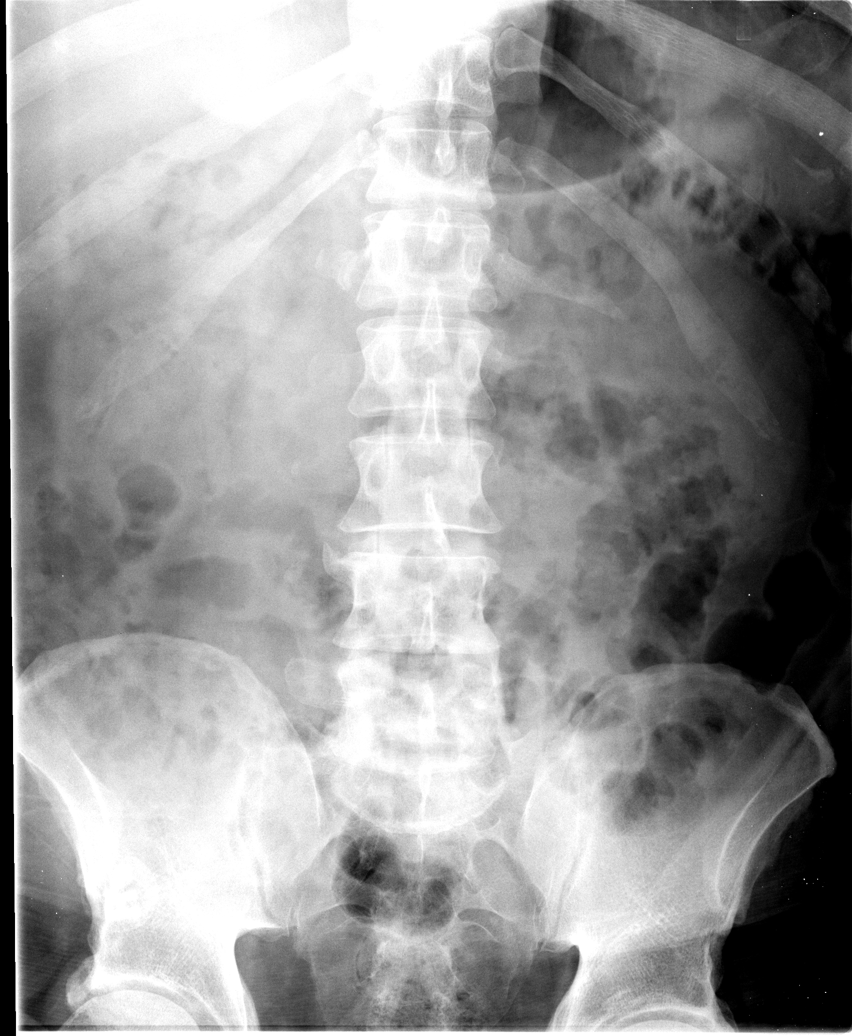

[view not recorded (2 of 5)]
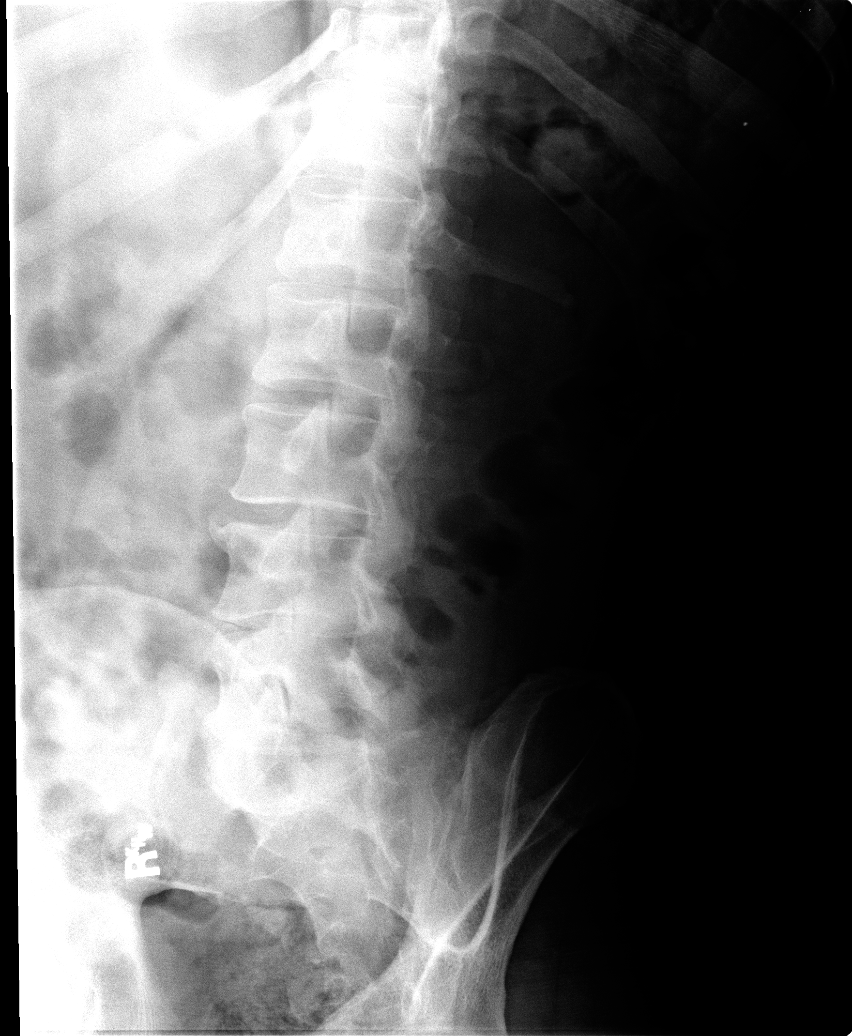

[view not recorded (3 of 5)]
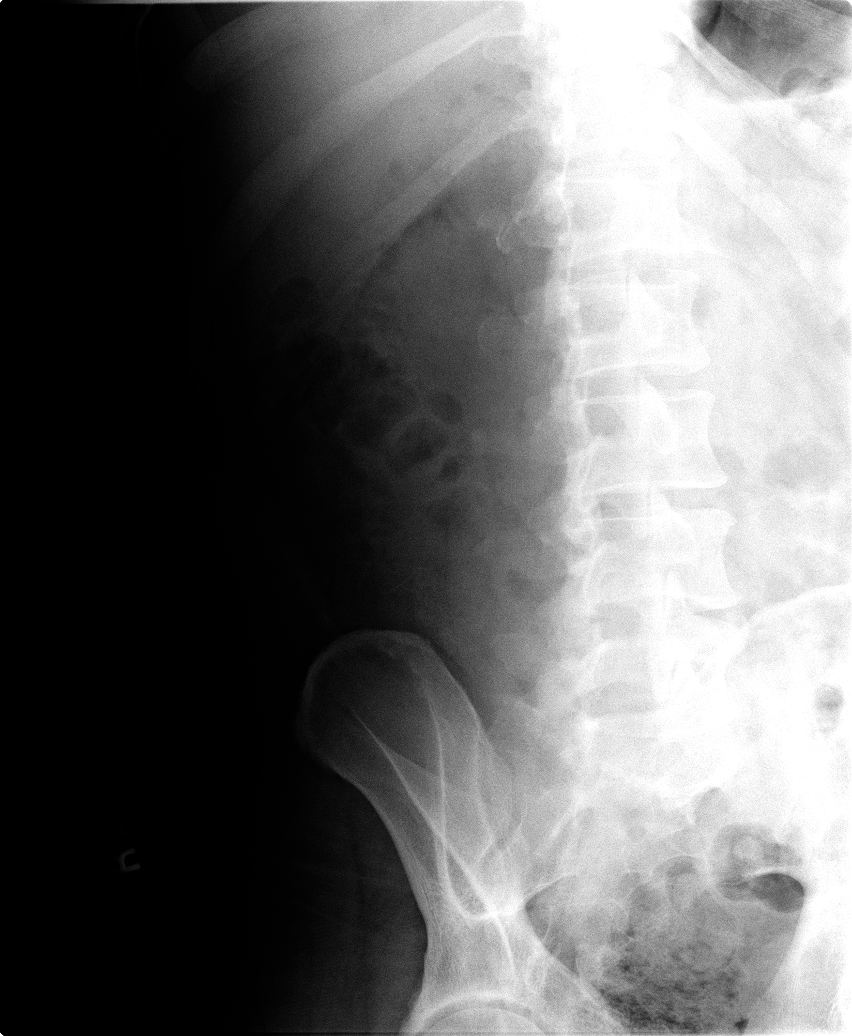

[view not recorded (4 of 5)]
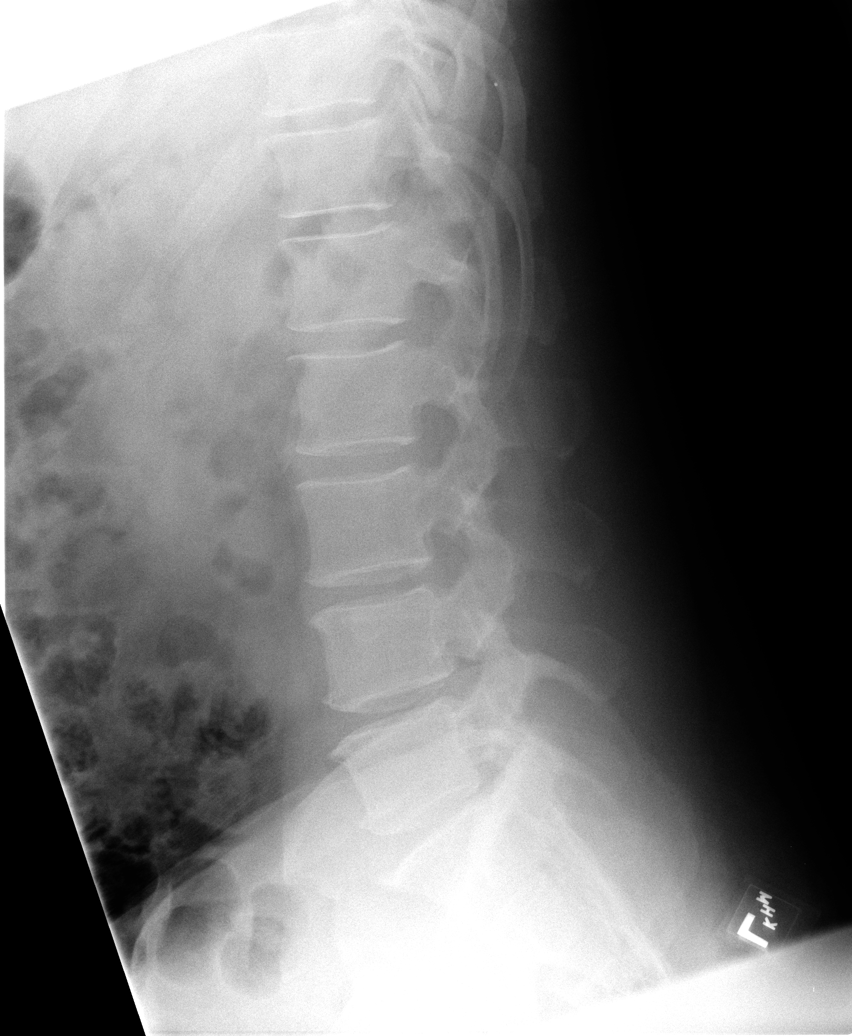

[view not recorded (5 of 5)]
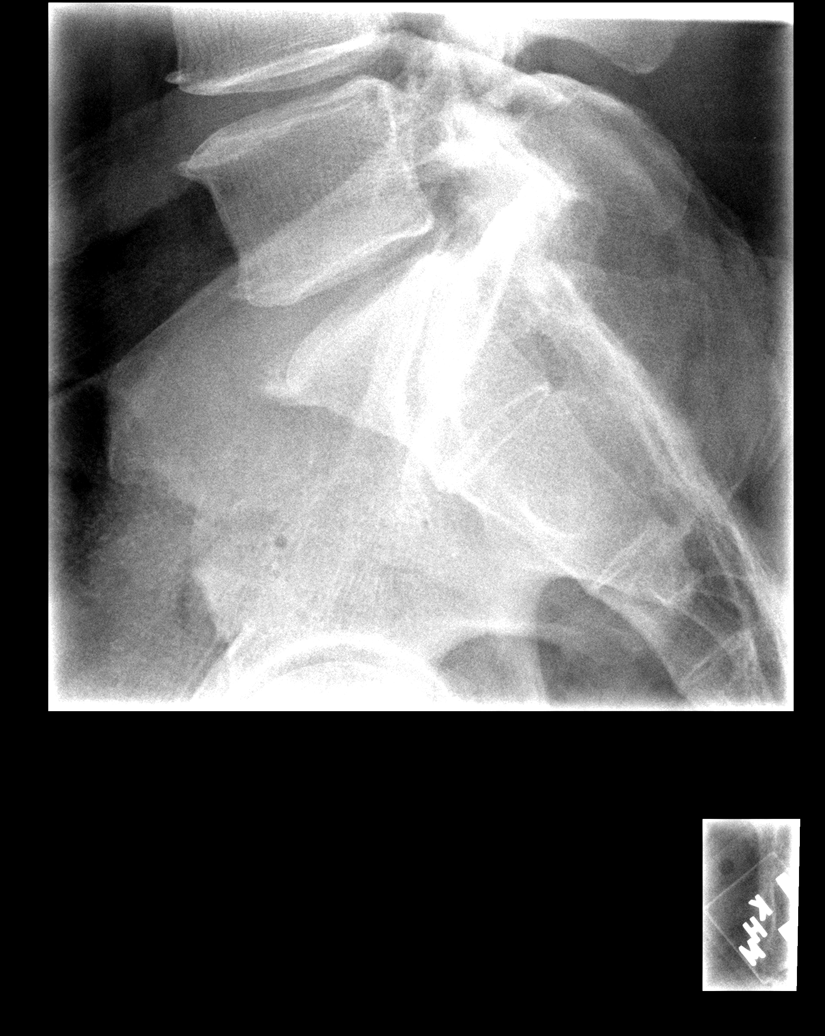

[5 of 5 positions shown; findings below may reference images not displayed]

FINDINGS: Vestigial left rib at T12.  Five lumbar-type vertebral
bodies.

Normal lumbar lordosis.

No evidence of fracture dislocation.  Vertebral body heights are
maintained.

Mild degenerative changes of the lower lumbar spine.

Visualized bony pelvis appears intact.
IMPRESSION: No fracture or dislocation is seen.

Mild degenerative changes of the lower lumbar spine.

## 2017-05-31 ENCOUNTER — Emergency Department (HOSPITAL_COMMUNITY)
Admission: EM | Admit: 2017-05-31 | Discharge: 2017-05-31 | Disposition: A | Discharge status: Home / Self Care | Payer: Self-pay | Attending: Emergency Medicine | Admitting: Emergency Medicine

## 2017-05-31 ENCOUNTER — Encounter (HOSPITAL_COMMUNITY): Payer: Self-pay | Admitting: *Deleted

## 2017-05-31 DIAGNOSIS — Y939 Activity, unspecified: Secondary | ICD-10-CM | POA: Insufficient documentation

## 2017-05-31 DIAGNOSIS — Y929 Unspecified place or not applicable: Secondary | ICD-10-CM | POA: Insufficient documentation

## 2017-05-31 DIAGNOSIS — S91311A Laceration without foreign body, right foot, initial encounter: Secondary | ICD-10-CM | POA: Insufficient documentation

## 2017-05-31 DIAGNOSIS — Y33XXXA Other specified events, undetermined intent, initial encounter: Secondary | ICD-10-CM | POA: Insufficient documentation

## 2017-05-31 DIAGNOSIS — Y999 Unspecified external cause status: Secondary | ICD-10-CM | POA: Insufficient documentation

## 2017-05-31 DIAGNOSIS — F17210 Nicotine dependence, cigarettes, uncomplicated: Secondary | ICD-10-CM | POA: Insufficient documentation

## 2017-05-31 DIAGNOSIS — F10920 Alcohol use, unspecified with intoxication, uncomplicated: Secondary | ICD-10-CM | POA: Insufficient documentation

## 2017-05-31 MED ORDER — SULFAMETHOXAZOLE-TRIMETHOPRIM 800-160 MG PO TABS: 1.0000 | ORAL_TABLET | Freq: Two times a day (BID) | ORAL | 0 refills | Status: AC

## 2017-05-31 MED ORDER — TETANUS-DIPHTH-ACELL PERTUSSIS 5-2.5-18.5 LF-MCG/0.5 IM SUSP
0.5000 mL | Freq: Once | INTRAMUSCULAR | Status: DC
  Filled 2017-05-31: qty 0.5

## 2017-05-31 MED ORDER — LIDOCAINE-EPINEPHRINE 2 %-1:200000 IJ SOLN
10.0000 mL | Freq: Once | INTRAMUSCULAR | Status: AC
Start: 2017-05-31 — End: 2017-05-31
  Administered 2017-05-31: 5 mL
  Filled 2017-05-31: qty 20

## 2017-05-31 NOTE — ED Provider Notes (Signed)
[facility]-EMERGENCY DEPT Provider Note   CSN: [id number] Arrival date & time: 05/31/17  1826     History   Chief Complaint Chief Complaint  Patient presents with  . Extremity Laceration    HPI Anthony Bright is a 51 y.o. male presenting with right foot injury.  Patient states he is unsure how or when he injured his right foot. He states that he chronically has issues with this foot, as he has had ankle surgery, but sensation feels no different than normal. Patient is ambulatory. He denies injury elsewhere. Patient obviously inebriated. He states he got off work this morning, and went drinking with his friends. He does not know how much he drank. He denies any drug use.  Patient's blood pressure elevated at triage. Patient states he does not think he has high blood pressure, and does not take medicine for this. Patient reports his only medical problem is chronic back pain from herniated disc.  HPI  History reviewed. No pertinent past medical history.  There are no active problems to display for this patient.   Past Surgical History:  Procedure Laterality Date  . ANKLE SURGERY         Home Medications    Prior to Admission medications   Medication Sig Start Date End Date Taking? Authorizing Provider  HYDROcodone-acetaminophen (NORCO/VICODIN) 5-325 MG per tablet Take 1 tablet by mouth every 4 (four) hours as needed. 02/01/14   Ward, Layla Maw, DO  sulfamethoxazole-trimethoprim (BACTRIM DS,SEPTRA DS) 800-160 MG tablet Take 1 tablet by mouth 2 (two) times daily. 05/31/17 06/07/17  Yolanda Dockendorf, PA-C    Family History History reviewed. No pertinent family history.  Social History Social History  Substance Use Topics  . Smoking status: Current Every Day Smoker    Types: Cigarettes  . Smokeless tobacco: Never Used  . Alcohol use Yes     Comment: admits today 05/31/17-liquor      Allergies   Strawberry extract   Review of Systems Review of Systems    Constitutional: Negative for chills and fever.  Musculoskeletal: Negative for gait problem.  Skin: Positive for wound.     Physical Exam Updated Vital Signs BP (!) 159/105 (BP Location: Right Arm)   Pulse 80   Temp 98.7 F (37.1 C) (Oral)   Resp 18   Ht 6' (1.829 m)   Wt 108.9 kg (240 lb)   SpO2 98%   BMI 32.55 kg/m   Physical Exam  Constitutional: He is oriented to person, place, and time. He appears well-developed and well-nourished. No distress.  HENT:  Head: Normocephalic and atraumatic.  Right Ear: External ear normal.  Left Ear: External ear normal.  Nose: Nose normal.  Mouth/Throat: Uvula is midline, oropharynx is clear and moist and mucous membranes are normal.  Eyes: Conjunctivae are normal. Pupils are equal, round, and reactive to light.  Neck: Normal range of motion.  Full range of motion the neck without pain.  Cardiovascular: Normal rate and regular rhythm.   Pulmonary/Chest: Effort normal and breath sounds normal.  Abdominal: Soft. There is no tenderness.  Musculoskeletal:  Full range of motion of the extremities 4. Sensation intact x4. Pulses intact x4. Color and warmth equal bilaterally. Patient with tenderness to his lower back. Patient states this is chronic and feels the same as normal.  Neurological: He is alert and oriented to person, place, and time.  Patient snoring on floor on initial presentation. Obviously inebriated. Took patient several minutes to fully awake, but once he was  awake, he was alert and oriented.  Skin: Skin is warm and dry.  Laceration of foot appearing puncture-like. No active bleeding at this time. Small flap of skin exposed, puncture wound is 0.5 cm deep. Entire area is dirty, and grass is sticking out of the wound. No signs of injury elsewhere.  Psychiatric: He has a normal mood and affect.  Nursing note and vitals reviewed.    ED Treatments / Results  Labs (all labs ordered are listed, but only abnormal results are  displayed) Labs Reviewed - No data to display  EKG  EKG Interpretation None       Radiology No results found.  Procedures Irrigation Date/Time: 05/31/2017 8:03 PM Performed by: Alveria Apley Authorized by: Alveria Apley  Consent: Verbal consent obtained. Risks and benefits: risks, benefits and alternatives were discussed Consent given by: patient Local anesthesia used: yes Anesthesia: local infiltration  Anesthesia: Local anesthesia used: yes Local Anesthetic: lidocaine 2% with epinephrine Anesthetic total: 5 mL  Sedation: Patient sedated: no Patient tolerance: Patient tolerated the procedure well with no immediate complications    (including critical care time)  Medications Ordered in ED Medications  lidocaine-EPINEPHrine (XYLOCAINE W/EPI) 2 %-1:200000 (PF) injection 10 mL (5 mLs Infiltration Given 05/31/17 1952)     Initial Impression / Assessment and Plan / ED Course  I have reviewed the triage vital signs and the nursing notes.  Pertinent labs & imaging results that were available during my care of the patient were reviewed by me and considered in my medical decision making (see chart for details).     Patient presenting with right foot laceration. Unsure how or when he injured his foot. Patient also inebriated. Laceration is extremely dirty with grass sticking out of it. Discussed the need to clean it with patient. Patient refused stitches stating that he needs to go to work tonight. Discussed importance of cleaning the area, and patient states that it is okay if we irrigate and apply dressing to the area. No sign of injury elsewhere. Patient denying pain to his head, neck, and reporting chronic back pain which feels the same as usual. Additionally, patient's blood pressure is elevated. Patient without a history of high blood pressure, not taking any medicine for this. Patient instructed to follow-up in one week for further evaluation. Discussed case with  attending, and patient was evaluated by Dr. Estell Harpin and he agrees to plan. Patient states he does not know when his last tetanus shot was. Pt to follow-up in one week regarding foot and blood pressure.  When RN went to give tdap, patient refused, stating that he had one 3 years ago. Per chart review, patient had one in 2013. Area was numbed and laceration was irrigated. Clean dressing applied. Patient's niece in room when discharge instructions were given. Patient to be given rx for antibiotic, as wound was extremely dirty, an unknown amount of time has passed since injury. Discussed importance of keeping the area clean and dry. Discussed patient will have pain from this area, and he may take Tylenol for this. Patient to follow-up in one week with primary care for evaluation of his foot and blood pressure. Return precautions given. Patient and his niece state they understand and agrees to plan.   Final Clinical Impressions(s) / ED Diagnoses   Final diagnoses:  Foot laceration, right, initial encounter    New Prescriptions Discharge Medication List as of 05/31/2017  8:12 PM    START taking these medications   Details  sulfamethoxazole-trimethoprim (BACTRIM DS,SEPTRA  DS) 800-160 MG tablet Take 1 tablet by mouth 2 (two) times daily., Starting Wed 05/31/2017, Until Wed 06/07/2017, Print         Lamontaccavale, Coty Student, PA-C 06/01/17 0049    Alveria ApleyCaccavale, Cordell Coke, PA-C 06/01/17 0050    Bethann BerkshireZammit, Joseph, MD 06/01/17 [phone]

## 2017-05-31 NOTE — ED Triage Notes (Signed)
Pt noted lac to right foot, pt unable to state what happened.

## 2017-05-31 NOTE — Discharge Instructions (Signed)
Take the antibiotics as prescribed. Use Tylenol as needed for pain control. It is very important that you follow-up with primary care in 1 week for evaluation of your foot and blood pressure. [facility], or follow-up with the urgent care that you go to for primary care.  Keep the area clean and dry. Change the dressing daily. Do not soak for extended periods of time. You may shower, but then pat the area dry and reapply dressing.  Return to the emergency department if he developed fever, chills, streaking redness up the foot, or any new or worsening symptoms.

## 2017-05-31 NOTE — ED Notes (Addendum)
Pt came into room at fast track.  Pt in intoxicated.  BP elevated prior to arrival and denies taking BP medication.  Pt found on floor.  Pt doesn't know how he got on floor. PA and RN in room and assisted patient back to bed.  Pt complaining of chronic back pain as well as triage compliant. RN and PA unable to make out what pt is saying due to alcohol intoxication.  VS taken and BP remains high but pt states "it stays that way".  Consulting civil engineerCharge RN and PA aware.  Pt refused any treatment besides cleaning foot wound.  PA aware.

## 2020-04-01 ENCOUNTER — Other Ambulatory Visit: Payer: Self-pay

## 2020-04-01 ENCOUNTER — Emergency Department (HOSPITAL_COMMUNITY)
Admission: EM | Admit: 2020-04-01 | Discharge: 2020-04-01 | Discharge status: Against Medical Advice | Payer: Self-pay | Attending: Emergency Medicine | Admitting: Emergency Medicine

## 2020-04-01 DIAGNOSIS — M791 Myalgia, unspecified site: Secondary | ICD-10-CM | POA: Insufficient documentation

## 2020-04-01 DIAGNOSIS — R072 Precordial pain: Secondary | ICD-10-CM | POA: Insufficient documentation

## 2020-04-01 DIAGNOSIS — R05 Cough: Secondary | ICD-10-CM | POA: Insufficient documentation

## 2020-04-01 DIAGNOSIS — F17210 Nicotine dependence, cigarettes, uncomplicated: Secondary | ICD-10-CM | POA: Insufficient documentation

## 2020-04-01 DIAGNOSIS — R059 Cough, unspecified: Secondary | ICD-10-CM

## 2020-04-01 NOTE — ED Notes (Signed)
Pt refusing medical care, states he needs to go and he thinks he will be ok. AMA discussed with patient and pt verbalized understanding.  AMA signed.

## 2020-04-01 NOTE — ED Notes (Signed)
Pt smells of ETOH and admits to drinking today.

## 2020-04-01 NOTE — ED Provider Notes (Signed)
[facility] EMERGENCY DEPARTMENT Provider Note   CSN: [id number] Arrival date & time: 04/01/20  1759     History Chief Complaint  Patient presents with  . Chest Pain    Anthony Bright is a 54 y.o. male with no significant past medical history who presents for evaluation of chest pain.  Patient states he has had substernal and left chest pain which began 2 days ago.  Initially stated he had current pain and then correct himself and that he did not have any pain.  States he is also had congestion and rhinorrhea which he relates to allergies.  He has been taking Claritin.  No radiation of chest pain into left jaw, arm.  No associated diaphoresis, nausea or vomiting.  Also admits to some generalized body aches which began last week.  No known Covid exposures.  He does have a dry productive cough however relates that to his allergies as well.  He denies any PND, orthopnea, history of PE, DVT, recent surgery, immobilization, clotting disorders.  Not take anything for his symptoms.  Denies additional aggravating or alleviating factors. Denies NSAID use. Admits to intermittent EtOh use last drink 2 beers with lunch today. Denies prior hx of DT or WD seizures. Denies hx of GI bleed or abd pain.  Denies fever, chills, nausea, vomiting, hemoptysis, shortness of breath, abdominal pain, diarrhea, dysuria, melena, bright red blood per rectum.  History obtained from patient and past medical records.  No interpreter is used.   HPI     No past medical history on file.  There are no problems to display for this patient.   Past Surgical History:  Procedure Laterality Date  . ANKLE SURGERY         No family history on file.  Social History   Tobacco Use  . Smoking status: Current Every Day Smoker    Types: Cigarettes  . Smokeless tobacco: Never Used  Substance Use Topics  . Alcohol use: Yes    Comment: admits today 05/31/17-liquor   . Drug use: No    Home Medications Prior to  Admission medications   Medication Sig Start Date End Date Taking? Authorizing Provider  HYDROcodone-acetaminophen (NORCO/VICODIN) 5-325 MG per tablet Take 1 tablet by mouth every 4 (four) hours as needed. 02/01/14   Ward, Layla Maw, DO    Allergies    Strawberry extract  Review of Systems   Review of Systems  Constitutional: Negative.   HENT: Positive for congestion, postnasal drip and rhinorrhea. Negative for drooling, sinus pressure, sneezing, sore throat, tinnitus, trouble swallowing and voice change.   Eyes: Negative.   Respiratory: Positive for cough. Negative for apnea, choking, chest tightness, shortness of breath, wheezing and stridor.   Cardiovascular: Positive for chest pain. Negative for palpitations and leg swelling.  Gastrointestinal: Negative.   Genitourinary: Negative.   Musculoskeletal: Positive for myalgias. Negative for arthralgias, back pain, gait problem, joint swelling, neck pain and neck stiffness.  Skin: Negative.   Neurological: Negative.   All other systems reviewed and are negative.   Physical Exam Updated Vital Signs BP (!) 170/111 (BP Location: Left Arm)   Pulse 92   Temp 98.2 F (36.8 C) (Oral)   Resp 12   Ht 6' (1.829 m)   Wt 104.3 kg   SpO2 95%   BMI 31.19 kg/m   Physical Exam Vitals and nursing note reviewed.  Constitutional:      General: He is not in acute distress.    Appearance: He is  not ill-appearing, toxic-appearing or diaphoretic.  HENT:     Head: Normocephalic and atraumatic.     Jaw: There is normal jaw occlusion.     Nose: Nose normal.     Mouth/Throat:     Mouth: Mucous membranes are moist.  Eyes:     Extraocular Movements: Extraocular movements intact.  Neck:     Vascular: No carotid bruit or JVD.     Trachea: Trachea and phonation normal.     Meningeal: Brudzinski's sign and Kernig's sign absent.  Cardiovascular:     Rate and Rhythm: Normal rate.     Pulses: Normal pulses.          Radial pulses are 2+ on the right  side and 2+ on the left side.       Posterior tibial pulses are 2+ on the right side and 2+ on the left side.     Heart sounds: Normal heart sounds.  Pulmonary:     Effort: Pulmonary effort is normal.     Breath sounds: Normal breath sounds and air entry.  Chest:     Chest wall: No deformity, swelling, tenderness, crepitus or edema.  Abdominal:     General: Bowel sounds are normal.     Palpations: Abdomen is soft.     Tenderness: There is no abdominal tenderness. There is no guarding or rebound.  Musculoskeletal:        General: Normal range of motion.     Cervical back: Full passive range of motion without pain, normal range of motion and neck supple.     Right lower leg: No tenderness. No edema.     Left lower leg: No tenderness. No edema.     Comments: No edema, erythema or warmth.  Compartments soft.  No evidence of DVT.  Feet:     Right foot:     Skin integrity: Skin integrity normal.     Left foot:     Skin integrity: Skin integrity normal.  Skin:    General: Skin is warm.     Capillary Refill: Capillary refill takes less than 2 seconds.     Comments: Brisk capillary refill.  Neurological:     General: No focal deficit present.     Mental Status: He is alert and oriented to person, place, and time.     Cranial Nerves: Cranial nerves are intact.     Sensory: Sensation is intact.     Motor: Motor function is intact.     Coordination: Coordination is intact.     Gait: Gait is intact.     Deep Tendon Reflexes: Reflexes are normal and symmetric.     Comments: Ambulatory without ataxic gait.    ED Results / Procedures / Treatments   Labs (all labs ordered are listed, but only abnormal results are displayed) Labs Reviewed  CBC WITH DIFFERENTIAL/PLATELET  BASIC METABOLIC PANEL  ETHANOL  TROPONIN I (HIGH SENSITIVITY)    EKG EKG Interpretation  Date/Time:  Wednesday Apr 01 2020 18:16:12 EDT Ventricular Rate:  87 PR Interval:    QRS Duration: 95 QT  Interval:  361 QTC Calculation: 435 R Axis:   19 Text Interpretation: Sinus rhythm Confirmed by Fredia Sorrow [phone]) on 04/01/2020 6:17:56 PM   Radiology No results found.  Procedures Procedures (including critical care time)  Medications Ordered in ED Medications - No data to display  ED Course  I have reviewed the triage vital signs and the nursing notes.  Pertinent labs & imaging results that  were available during my care of the patient were reviewed by me and considered in my medical decision making (see chart for details).  54 year old male presents for evaluation of chest pain.  Nonexertional nonpleuritic in nature.  No evidence of DVT on exam.  Has had some congestion, rhinorrhea and cough nonproductive which she relates to seasonal allergies.  Has been taking Claritin.  No unilateral leg swelling, redness or warmth.  He is Wells criteria low risk.  No pain radiating to back, left arm or jaw.  Low suspicion for dissection.  He is hypertensive however has not taken his home blood pressure medications.  He has a nonfocal neuro exam without deficits.  Equal pulses bilaterally.  Tactile temperature to extremities.  Abdomen soft, nontender.  No chronic NSAID use however is an occasional alcohol drinker.  Apparently had 2 drinks with lunch.  He is ambulatory without ataxic gait.  Appears appropriate on exam.  Plan on labs, imaging and reassess.  I was notified by nursing that patient left AGAINST MEDICAL ADVICE.  I did not get to discuss with patient risks versus benefit of leaving the ED against medical advice prior to him leaving the department as I was notified after the was off hospital property. Patient did appear to have capacity to make medical decision on my initial evaluation.    MDM Rules/Calculators/A&P                       Final Clinical Impression(s) / ED Diagnoses Final diagnoses:  Precordial pain  Cough  Myalgia    Rx / DC Orders ED Discharge Orders    None        Mollyann Halbert A, PA-C 04/01/20 1915    Vanetta Mulders, MD 04/02/20 2015

## 2020-04-01 NOTE — ED Triage Notes (Signed)
Pt with left sided cp for 2 days, denies any sob or N/V.
